# Patient Record
Sex: Female | Born: 2000 | Race: Black or African American | Hispanic: No | Marital: Single | Smoking: Never smoker
Health system: Southern US, Community
[De-identification: ages and names within clinical notes are randomized; demographics above are authoritative.]

## PROBLEM LIST (undated history)

## (undated) DIAGNOSIS — I10 Essential (primary) hypertension: Secondary | ICD-10-CM

## (undated) DIAGNOSIS — E119 Type 2 diabetes mellitus without complications: Secondary | ICD-10-CM

## (undated) DIAGNOSIS — E669 Obesity, unspecified: Secondary | ICD-10-CM

## (undated) DIAGNOSIS — M9250 Juvenile osteochondrosis of tibia and fibula, unspecified leg: Secondary | ICD-10-CM

## (undated) DIAGNOSIS — M92519 Juvenile osteochondrosis of proximal tibia, unspecified leg: Secondary | ICD-10-CM

## (undated) DIAGNOSIS — J302 Other seasonal allergic rhinitis: Secondary | ICD-10-CM

## (undated) DIAGNOSIS — J45909 Unspecified asthma, uncomplicated: Secondary | ICD-10-CM

## (undated) HISTORY — DX: Other seasonal allergic rhinitis: J30.2

## (undated) HISTORY — DX: Obesity, unspecified: E66.9

## (undated) HISTORY — DX: Unspecified asthma, uncomplicated: J45.909

## (undated) HISTORY — PX: KNEE SURGERY: SHX244

---
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
## 2020-09-27 ENCOUNTER — Encounter (HOSPITAL_COMMUNITY): Payer: Self-pay

## 2020-09-27 ENCOUNTER — Other Ambulatory Visit: Payer: Self-pay

## 2020-09-27 ENCOUNTER — Ambulatory Visit (HOSPITAL_COMMUNITY)
Admission: EM | Admit: 2020-09-27 | Discharge: 2020-09-27 | Disposition: A | Discharge status: Home / Self Care | Payer: Medicaid Other | Attending: Family Medicine | Admitting: Family Medicine

## 2020-09-27 DIAGNOSIS — Z20822 Contact with and (suspected) exposure to covid-19: Secondary | ICD-10-CM | POA: Diagnosis not present

## 2020-09-27 DIAGNOSIS — H66002 Acute suppurative otitis media without spontaneous rupture of ear drum, left ear: Secondary | ICD-10-CM | POA: Diagnosis present

## 2020-09-27 DIAGNOSIS — J4521 Mild intermittent asthma with (acute) exacerbation: Secondary | ICD-10-CM | POA: Insufficient documentation

## 2020-09-27 DIAGNOSIS — J069 Acute upper respiratory infection, unspecified: Secondary | ICD-10-CM

## 2020-09-27 MED ORDER — ACETAMINOPHEN 325 MG PO TABS: 650.0000 mg | ORAL_TABLET | Freq: Four times a day (QID) | ORAL | 0 refills | Status: AC | PRN

## 2020-09-27 MED ORDER — AMOXICILLIN-POT CLAVULANATE 400-57 MG/5ML PO SUSR: 875.0000 mg | Freq: Two times a day (BID) | ORAL | 0 refills | Status: AC

## 2020-09-27 MED ORDER — ALBUTEROL SULFATE HFA 108 (90 BASE) MCG/ACT IN AERS: 1.0000 | INHALATION_SPRAY | Freq: Four times a day (QID) | RESPIRATORY_TRACT | 0 refills | Status: AC | PRN

## 2020-09-27 NOTE — ED Notes (Signed)
Pt status assessed at registration. O2 99% and lungs clear to auscultation

## 2020-09-27 NOTE — ED Provider Notes (Signed)
[facility]    CSN: [id number] Arrival date & time: 09/27/20  1731      History   Chief Complaint Chief Complaint  Patient presents with  . Otalgia  . Wheezing  . Nasal Congestion  . Medication Refill    HPI Caroline Reid is a 19 y.o. female.   Patient presenting today with 2 day hx of left ear pain, muffled hearing, wheezing, cough, congestion, fatigue. Denies CP, SOB, abdominal pain, N/V/D. So far taking ibuprofen with minimal relief. Hx of asthma, out of albuterol inhaler and requesting refills. No known sick contacts.      Past Medical History:  Diagnosis Date  . Asthma   . Blount's disease   . Diabetes mellitus without complication (HCC)   . Hypertension   . Obesity   . Seasonal allergies 2004    Patient Active Problem List   Diagnosis Date Noted  . Hypertriglyceridemia 10/19/2014  . Tachycardia with 100 - 120 beats per minute 10/19/2014  . Type 2 diabetes mellitus without complication (HCC) 10/19/2014  . Morbid obesity (HCC) 01/04/2014  . Essential hypertension, benign 01/04/2014  . Acquired acanthosis nigricans 01/04/2014  . Vitamin D deficiency disease 01/04/2014    Past Surgical History:  Procedure Laterality Date  . KNEE SURGERY Bilateral 2000    OB History   No obstetric history on file.      Home Medications    Prior to Admission medications   Medication Sig Start Date End Date Taking? Authorizing Provider  acetaminophen (TYLENOL) 325 MG tablet Take 2 tablets (650 mg total) by mouth every 6 (six) hours as needed. 09/27/20   Particia Nearing, PA-C  albuterol (PROVENTIL) (2.5 MG/3ML) 0.083% nebulizer solution Take 2.5 mg by nebulization every 6 (six) hours as needed for wheezing or shortness of breath.    [provider]  albuterol (VENTOLIN HFA) 108 (90 Base) MCG/ACT inhaler Inhale 1-2 puffs into the lungs every 6 (six) hours as needed for wheezing or shortness of breath. 09/27/20   Particia Nearing, PA-C    amoxicillin-clavulanate (AUGMENTIN) 400-57 MG/5ML suspension Take 10.9 mLs (875 mg total) by mouth 2 (two) times daily for 7 days. 09/27/20 10/04/20  Particia Nearing, PA-C  beclomethasone (QVAR) 80 MCG/ACT inhaler Inhale into the lungs 2 (two) times daily.    [provider]  calcium-vitamin D 250-100 MG-UNIT per tablet Take 1 tablet by mouth 2 (two) times daily. Reported on 02/06/2016    [provider]  Cetirizine HCl 10 MG CAPS Take by mouth.    [provider]  fluticasone (FLONASE) 50 MCG/ACT nasal spray Place into both nostrils daily.    [provider]  lisinopril (PRINIVIL,ZESTRIL) 20 MG tablet Take 1 tablet (20 mg total) by mouth daily. 05/04/15   Verneda Skill, FNP  metFORMIN (GLUCOPHAGE) 500 MG tablet Take 1 tablet by moth in the morning and 2 tablets by mouth in the evening 12/20/14   Alfonso Ramus T, FNP  Multiple Vitamin (MULTIVITAMIN) tablet Take 1 tablet by mouth daily.    [provider]  naproxen (NAPROSYN) 500 MG tablet Take 1 tablet (500 mg total) by mouth 2 (two) times daily with a meal. 07/05/16   Young, Dillard Cannon, PA-C  norethindrone-ethinyl estradiol-iron (ESTROSTEP FE,TILIA FE,TRI-LEGEST FE) 1-20/1-30/1-35 MG-MCG tablet Take 1 tablet by mouth daily.    [provider]    Family History Family History  Problem Relation Age of Onset  . Hypertension Mother   . Hypertension Maternal Grandfather   .  Hypertension Other   . Cancer Other     Social History Social History   Tobacco Use  . Smoking status: Never Smoker  . Smokeless tobacco: Never Used  Substance Use Topics  . Alcohol use: Not on file  . Drug use: Not on file     Allergies   Latex and Pineapple   Review of Systems Review of Systems PER HPI    Physical Exam Triage Vital Signs ED Triage Vitals  Enc Vitals Group     BP 09/27/20 1752 (!) 151/99     Pulse Rate 09/27/20 1752 (!) 103     Resp 09/27/20 1752 (!) 26     Temp 09/27/20  1752 97.8 F (36.6 C)     Temp Source 09/27/20 1752 Oral     SpO2 09/27/20 1752 100 %     Weight --      Height --      Head Circumference --      Peak Flow --      Pain Score 09/27/20 1749 8     Pain Loc --      Pain Edu? --      Excl. in GC? --    No data found.  Updated Vital Signs BP (!) 151/99 (BP Location: Right Wrist)   Pulse (!) 103   Temp 97.8 F (36.6 C) (Oral)   Resp (!) 26   LMP  (Within Months) Comment: 1 month   SpO2 100%   Visual Acuity Right Eye Distance:   Left Eye Distance:   Bilateral Distance:    Right Eye Near:   Left Eye Near:    Bilateral Near:     Physical Exam Vitals and nursing note reviewed.  Constitutional:      Appearance: Normal appearance. She is not ill-appearing.  HENT:     Head: Atraumatic.     Right Ear: Tympanic membrane and ear canal normal.     Ears:     Comments: Left TM erythematous, edematous    Nose: Rhinorrhea present.     Mouth/Throat:     Mouth: Mucous membranes are moist.     Pharynx: Posterior oropharyngeal erythema present.  Eyes:     Extraocular Movements: Extraocular movements intact.     Conjunctiva/sclera: Conjunctivae normal.  Cardiovascular:     Rate and Rhythm: Normal rate and regular rhythm.     Heart sounds: Normal heart sounds.  Pulmonary:     Effort: Pulmonary effort is normal.     Breath sounds: Wheezing present. No rales.  Abdominal:     General: Bowel sounds are normal. There is no distension.     Palpations: Abdomen is soft.     Tenderness: There is no abdominal tenderness. There is no guarding.  Musculoskeletal:        General: Normal range of motion.     Cervical back: Normal range of motion and neck supple.  Skin:    General: Skin is warm and dry.     Findings: No rash.  Neurological:     Mental Status: She is alert and oriented to person, place, and time.  Psychiatric:        Mood and Affect: Mood normal.        Thought Content: Thought content normal.        Judgment: Judgment  normal.     UC Treatments / Results  Labs (all labs ordered are listed, but only abnormal results are displayed) Labs Reviewed  SARS CORONAVIRUS 2 (TAT  6-24 HRS)    EKG   Radiology No results found.  Procedures Procedures (including critical care time)  Medications Ordered in UC Medications - No data to display  Initial Impression / Assessment and Plan / UC Course  I have reviewed the triage vital signs and the nursing notes.  Pertinent labs & imaging results that were available during my care of the patient were reviewed by me and considered in my medical decision making (see chart for details).     Vitals, exam stable today. States her elevated HR and BP are chronic and she is not on her medications for these. O2 saturation 100%, afebrile. Will tx with augmentin, tylenol, and refill albuterol inhaler. Supportive care reviewed. COVID pcr pending, isolate until results return. Return if sxs worsening or not resolving.   Final Clinical Impressions(s) / UC Diagnoses   Final diagnoses:  Viral URI with cough  Non-recurrent acute suppurative otitis media of left ear without spontaneous rupture of tympanic membrane  Mild intermittent asthma with exacerbation   Discharge Instructions   None    ED Prescriptions    Medication Sig Dispense Auth. Provider   albuterol (VENTOLIN HFA) 108 (90 Base) MCG/ACT inhaler Inhale 1-2 puffs into the lungs every 6 (six) hours as needed for wheezing or shortness of breath. 18 g Particia Nearing, [place]   amoxicillin-clavulanate (AUGMENTIN) 400-57 MG/5ML suspension Take 10.9 mLs (875 mg total) by mouth 2 (two) times daily for 7 days. 152.6 mL Particia Nearing, PA-C   acetaminophen (TYLENOL) 325 MG tablet Take 2 tablets (650 mg total) by mouth every 6 (six) hours as needed. 30 tablet Particia Nearing, [place]     PDMP not reviewed this encounter.   Particia Nearing, [place] 09/27/20 1935

## 2020-09-27 NOTE — ED Triage Notes (Addendum)
Pt reports bilateral ear pain since this morning; wheezing, nasal congestion x 2 days. Denies sob, cough, fever.   Requested albuterol inhaler refill.   Pt stopped taking metformin and Lisinopril 2 years ago, as she can not afford it.

## 2020-09-28 LAB — SARS CORONAVIRUS 2 (TAT 6-24 HRS): SARS Coronavirus 2: NEGATIVE

## 2020-11-17 ENCOUNTER — Other Ambulatory Visit: Payer: Medicaid Other

## 2020-11-17 DIAGNOSIS — Z20822 Contact with and (suspected) exposure to covid-19: Secondary | ICD-10-CM

## 2020-11-19 LAB — NOVEL CORONAVIRUS, NAA: SARS-CoV-2, NAA: NOT DETECTED

## 2020-11-19 LAB — SARS-COV-2, NAA 2 DAY TAT
